# Patient Record
Sex: Male | Born: 1955 | Hispanic: No | Marital: Married | Smoking: Current some day smoker
Health system: Southern US, Community
[De-identification: ages and names within clinical notes are randomized; demographics above are authoritative.]

## PROBLEM LIST (undated history)

## (undated) DIAGNOSIS — I10 Essential (primary) hypertension: Secondary | ICD-10-CM

## (undated) DIAGNOSIS — E663 Overweight: Secondary | ICD-10-CM

## (undated) HISTORY — DX: Overweight: E66.3

## (undated) HISTORY — DX: Essential (primary) hypertension: I10

---

## 1999-11-08 ENCOUNTER — Ambulatory Visit (HOSPITAL_COMMUNITY): Admission: RE | Admit: 1999-11-08 | Discharge: 1999-11-08 | Payer: Self-pay | Admitting: *Deleted

## 2001-01-09 ENCOUNTER — Other Ambulatory Visit: Admission: RE | Admit: 2001-01-09 | Discharge: 2001-01-09 | Payer: Self-pay | Admitting: Urology

## 2001-10-28 ENCOUNTER — Encounter: Admission: RE | Admit: 2001-10-28 | Discharge: 2001-10-28 | Payer: Self-pay | Admitting: Urology

## 2001-10-28 ENCOUNTER — Encounter: Payer: Self-pay | Admitting: Urology

## 2002-10-28 ENCOUNTER — Ambulatory Visit (HOSPITAL_COMMUNITY): Admission: RE | Admit: 2002-10-28 | Discharge: 2002-10-28 | Payer: Self-pay | Admitting: Chiropractor

## 2002-10-28 ENCOUNTER — Encounter: Payer: Self-pay | Admitting: Chiropractor

## 2011-01-17 ENCOUNTER — Encounter: Payer: Self-pay | Admitting: Internal Medicine

## 2011-01-19 ENCOUNTER — Other Ambulatory Visit: Payer: Self-pay | Admitting: Oncology

## 2011-01-19 ENCOUNTER — Encounter (HOSPITAL_BASED_OUTPATIENT_CLINIC_OR_DEPARTMENT_OTHER): Payer: BC Managed Care – PPO | Admitting: Oncology

## 2011-01-19 DIAGNOSIS — R109 Unspecified abdominal pain: Secondary | ICD-10-CM

## 2011-01-19 DIAGNOSIS — D751 Secondary polycythemia: Secondary | ICD-10-CM

## 2011-01-19 LAB — CBC WITH DIFFERENTIAL/PLATELET
BASO%: 0.4 % (ref 0.0–2.0)
Basophils Absolute: 0 10*3/uL (ref 0.0–0.1)
EOS%: 4.1 % (ref 0.0–7.0)
Eosinophils Absolute: 0.2 10*3/uL (ref 0.0–0.5)
HCT: 50.9 % — ABNORMAL HIGH (ref 38.4–49.9)
HGB: 17.6 g/dL — ABNORMAL HIGH (ref 13.0–17.1)
LYMPH%: 23.6 % (ref 14.0–49.0)
MCH: 31.1 pg (ref 27.2–33.4)
MCHC: 34.6 g/dL (ref 32.0–36.0)
MCV: 89.9 fL (ref 79.3–98.0)
MONO#: 0.3 10*3/uL (ref 0.1–0.9)
MONO%: 6.4 % (ref 0.0–14.0)
NEUT#: 3.5 10*3/uL (ref 1.5–6.5)
NEUT%: 65.5 % (ref 39.0–75.0)
Platelets: 143 10*3/uL (ref 140–400)
RBC: 5.67 10*6/uL (ref 4.20–5.82)
RDW: 13.6 % (ref 11.0–14.6)
WBC: 5.3 10*3/uL (ref 4.0–10.3)
lymph#: 1.3 10*3/uL (ref 0.9–3.3)

## 2011-01-19 LAB — CHCC SMEAR

## 2011-01-19 LAB — COMPREHENSIVE METABOLIC PANEL
Albumin: 4.5 g/dL (ref 3.5–5.2)
CO2: 27 mEq/L (ref 19–32)
Glucose, Bld: 92 mg/dL (ref 70–99)
Potassium: 3.7 mEq/L (ref 3.5–5.3)
Sodium: 140 mEq/L (ref 135–145)
Total Protein: 7.3 g/dL (ref 6.0–8.3)

## 2011-01-19 LAB — LACTATE DEHYDROGENASE: LDH: 197 U/L (ref 94–250)

## 2011-01-19 LAB — IRON AND TIBC: Iron: 122 ug/dL (ref 42–165)

## 2011-01-23 LAB — JAK-2 V617F

## 2011-01-26 ENCOUNTER — Ambulatory Visit (HOSPITAL_COMMUNITY)
Admission: RE | Admit: 2011-01-26 | Discharge: 2011-01-26 | Disposition: A | Discharge status: Home / Self Care | Payer: BC Managed Care – PPO | Source: Ambulatory Visit | Attending: Oncology | Admitting: Oncology

## 2011-01-26 DIAGNOSIS — N23 Unspecified renal colic: Secondary | ICD-10-CM | POA: Insufficient documentation

## 2011-01-26 DIAGNOSIS — R109 Unspecified abdominal pain: Secondary | ICD-10-CM

## 2011-01-26 DIAGNOSIS — R1031 Right lower quadrant pain: Secondary | ICD-10-CM | POA: Insufficient documentation

## 2011-01-26 DIAGNOSIS — Z87442 Personal history of urinary calculi: Secondary | ICD-10-CM | POA: Insufficient documentation

## 2011-01-26 DIAGNOSIS — N400 Enlarged prostate without lower urinary tract symptoms: Secondary | ICD-10-CM | POA: Insufficient documentation

## 2011-02-02 ENCOUNTER — Encounter (HOSPITAL_BASED_OUTPATIENT_CLINIC_OR_DEPARTMENT_OTHER): Payer: BC Managed Care – PPO | Admitting: Oncology

## 2011-02-02 DIAGNOSIS — D751 Secondary polycythemia: Secondary | ICD-10-CM

## 2011-02-17 ENCOUNTER — Encounter: Payer: Self-pay | Admitting: Cardiovascular Disease

## 2011-03-08 ENCOUNTER — Ambulatory Visit: Payer: BC Managed Care – PPO | Admitting: Cardiovascular Disease

## 2011-03-15 ENCOUNTER — Encounter: Payer: Self-pay | Admitting: Cardiovascular Disease

## 2011-03-16 ENCOUNTER — Encounter: Payer: Self-pay | Admitting: Cardiovascular Disease

## 2011-03-16 ENCOUNTER — Ambulatory Visit (INDEPENDENT_AMBULATORY_CARE_PROVIDER_SITE_OTHER): Payer: BC Managed Care – PPO | Admitting: Cardiovascular Disease

## 2011-03-16 DIAGNOSIS — R0600 Dyspnea, unspecified: Secondary | ICD-10-CM

## 2011-03-16 DIAGNOSIS — D751 Secondary polycythemia: Secondary | ICD-10-CM | POA: Insufficient documentation

## 2011-03-16 DIAGNOSIS — R0989 Other specified symptoms and signs involving the circulatory and respiratory systems: Secondary | ICD-10-CM

## 2011-03-16 DIAGNOSIS — I10 Essential (primary) hypertension: Secondary | ICD-10-CM

## 2011-03-16 DIAGNOSIS — R079 Chest pain, unspecified: Secondary | ICD-10-CM | POA: Insufficient documentation

## 2011-03-16 NOTE — Progress Notes (Signed)
55 yo referred by [place] for SSCP.  History through interpretor.  Seen at urgent care 02/17/11.  Reviewed ECG early repolarization and otherwise normal.  CRF;s HTN on ACE.  No history of CAD.  Atypical SSCP, lasts minutes, nonexertional but persistant since 7/7.  Hct 50.9.  No chronic lung disease, only 5 cigs / month.  Mild exertional dyspnea.  No history of murmur or congenital heart disease.  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  CXR with [place] scarring RLL nothing els  ROS: Denies fever, malais, weight loss, blurry vision, decreased visual acuity, cough, sputum, SOB, hemoptysis, pleuritic pain, palpitaitons, heartburn, abdominal pain, melena, lower extremity edema, claudication, or rash.  All other systems reviewed and negative   General: Affect appropriate Healthy:  appears stated age HEENT: normal Neck supple with no adenopathy JVP normal no bruits no thyromegaly Lungs clear with no wheezing and good diaphragmatic motion Heart:  S1/S2 no murmur,rub, gallop or click PMI normal Abdomen: benighn, BS positve, no tenderness, no AAA no bruit.  No HSM or HJR Distal pulses intact with no bruits No edema Neuro non-focal Skin warm and dry No muscular weakness  Medications Current Outpatient Prescriptions  Medication Sig Dispense Refill  . lisinopril (PRINIVIL,ZESTRIL) 10 MG tablet Take 10 mg by mouth daily.          Allergies Review of patient's allergies indicates no known allergies.  Family History: No family history on file.  Social History: History   Social History  . Marital Status: Married    Spouse Name: N/A    Number of Children: N/A  . Years of Education: N/A   Occupational History  . Not on file.   Social History Main Topics  . Smoking status: Not on file  . Smokeless tobacco: Not on file  . Alcohol Use:   . Drug Use:   . Sexually Active:    Other Topics Concern  . Not on file    Social History Narrative  . No narrative on file    Electrocardiogram:  NSR 61 Minimal voltage for LVH otherwise normal  Assessment and Plan

## 2011-03-16 NOTE — Assessment & Plan Note (Signed)
Atypical ECG ok  F/U ETT

## 2011-03-16 NOTE — Assessment & Plan Note (Signed)
Well controlled.  Continue current medications and low sodium Dash type diet.    

## 2011-03-16 NOTE — Assessment & Plan Note (Signed)
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  F/U hematology.  Will likely need RBC mass and ABG

## 2011-03-16 NOTE — Assessment & Plan Note (Signed)
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  R/O congenital heart disease and assess EF and LVH

## 2011-03-16 NOTE — Patient Instructions (Addendum)
Your physician recommends that you continue on your current medications as directed. Please refer to the Current Medication list given to you today.  You have been referred to  DR ENNEVER  DX ELEVATED HEMATOCRIT   Your physician has requested that you have an exercise tolerance test. For further information please visit [email or web address] Please also follow instruction sheet, as given.PT'S CONVENIENCE  WITH SCOTT [facility]  DX CHEST PAIN  Your physician has requested that you have an echocardiogram. Echocardiography is a painless test that uses sound waves to create images of your heart. It provides your doctor with information about the size and shape of your heart and how well your heart's chambers and valves are working. This procedure takes approximately one hour. There are no restrictions for this procedure. PT'S CONVENIENCE DX SHORTNESS OF BREATH

## 2011-03-27 ENCOUNTER — Encounter: Payer: Self-pay | Admitting: *Deleted

## 2011-04-06 ENCOUNTER — Other Ambulatory Visit (HOSPITAL_COMMUNITY): Payer: BC Managed Care – PPO | Admitting: Radiology

## 2011-04-06 ENCOUNTER — Encounter: Payer: BC Managed Care – PPO | Admitting: Physician Assistant

## 2011-04-10 ENCOUNTER — Telehealth (HOSPITAL_COMMUNITY): Payer: Self-pay | Admitting: Radiology

## 2011-04-10 NOTE — Telephone Encounter (Signed)
Pt was a NOS CALL PT @[phone] PHONE DC'D NO OTHER  NUMBER LISTED LETTER MAIL TO PT.TO CALL TO [facility] APPT.

## 2011-05-16 ENCOUNTER — Encounter: Payer: Self-pay | Admitting: Cardiovascular Disease

## 2011-08-25 ENCOUNTER — Telehealth: Payer: Self-pay | Admitting: Oncology

## 2011-08-25 NOTE — Telephone Encounter (Signed)
Mailed the pt his feb,june and oct 2013 appt calendar. Unable to reach the pt by telephone. Telephone number is disconnected. lmonvm of julia the interpreter to contact me regarding this pt

## 2011-08-27 NOTE — Telephone Encounter (Signed)
na

## 2011-08-28 ENCOUNTER — Telehealth: Payer: Self-pay | Admitting: Oncology

## 2011-08-28 NOTE — Telephone Encounter (Signed)
S/w luwana from clinical social work regarding the pt appts that needed to be set up with an interpreter for his various appts

## 2011-10-01 ENCOUNTER — Ambulatory Visit: Payer: BC Managed Care – PPO | Admitting: Physician Assistant

## 2011-10-01 ENCOUNTER — Other Ambulatory Visit: Payer: BC Managed Care – PPO | Admitting: Lab

## 2012-01-28 ENCOUNTER — Other Ambulatory Visit: Payer: BC Managed Care – PPO | Admitting: Lab

## 2012-01-29 ENCOUNTER — Telehealth: Payer: Self-pay | Admitting: Oncology

## 2012-01-29 NOTE — Telephone Encounter (Signed)
Received email from [place] @ switchboard to r/s pt's Monday lb appt (6/17). S/w pt and per due to he is leaving town for about the next 15 days he will call back to r/s when he returns.

## 2012-04-30 ENCOUNTER — Telehealth: Payer: Self-pay | Admitting: Oncology

## 2012-04-30 NOTE — Telephone Encounter (Signed)
s.w. pt and he wanted to cancel appt and call back when he gets in town. Called Dr Gaylyn Rong and he is aware.  sed

## 2012-05-18 ENCOUNTER — Other Ambulatory Visit: Payer: Self-pay | Admitting: Family Medicine

## 2012-05-19 NOTE — Telephone Encounter (Signed)
Chart pulled to PA pool at nurses station [phone]

## 2012-05-20 ENCOUNTER — Other Ambulatory Visit: Payer: Self-pay | Admitting: *Deleted

## 2012-05-30 ENCOUNTER — Ambulatory Visit: Payer: BC Managed Care – PPO | Admitting: Oncology

## 2012-05-30 ENCOUNTER — Other Ambulatory Visit: Payer: BC Managed Care – PPO | Admitting: Lab

## 2012-06-21 ENCOUNTER — Telehealth: Payer: Self-pay

## 2012-06-21 NOTE — Telephone Encounter (Signed)
Denied - patient needs an appointment

## 2012-06-21 NOTE — Telephone Encounter (Signed)
Last seen 9/12 by Dr. Neva Seat chart is at nurses station for review

## 2012-06-21 NOTE — Telephone Encounter (Signed)
[facility] called for pt in regards to refill on prescription Viagra. Pt was seen by Dr Neva Seat Please call [facility] back at [phone]

## 2012-06-23 NOTE — Telephone Encounter (Signed)
Called pt to to advise

## 2012-06-28 IMAGING — US US RENAL
1 series · 14 of 25 positions shown · non-contrast
Comparison: None.

CLINICAL DATA: Bilateral renal pain. Right-sided flank pain.
History kidney stones.

RENAL/URINARY TRACT ULTRASOUND COMPLETE

[Series 1: us renal · 0.30mm/px · 14 of 48 slices shown]
[im 1/48]
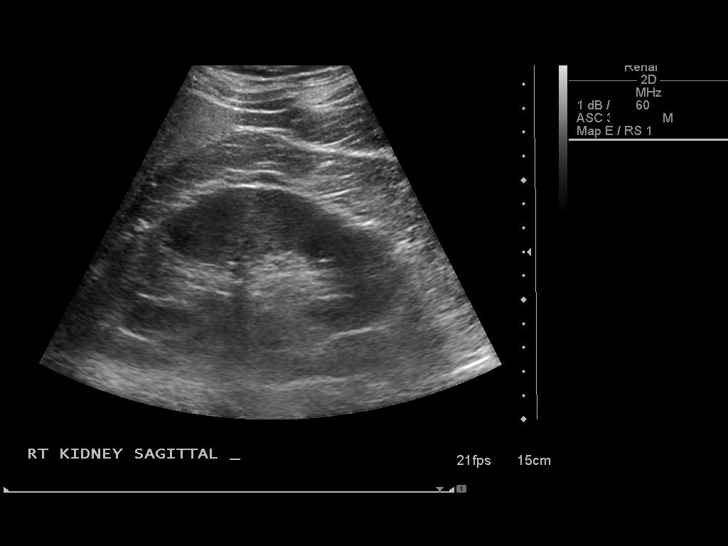
[im 4/48]
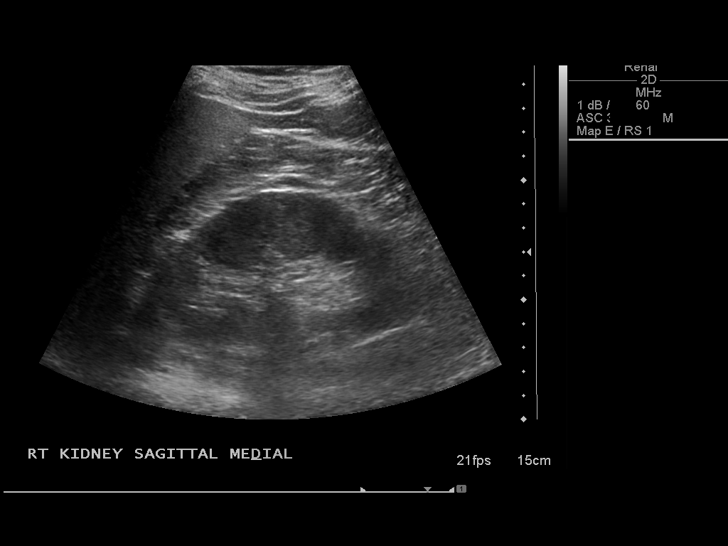
[im 8/48]
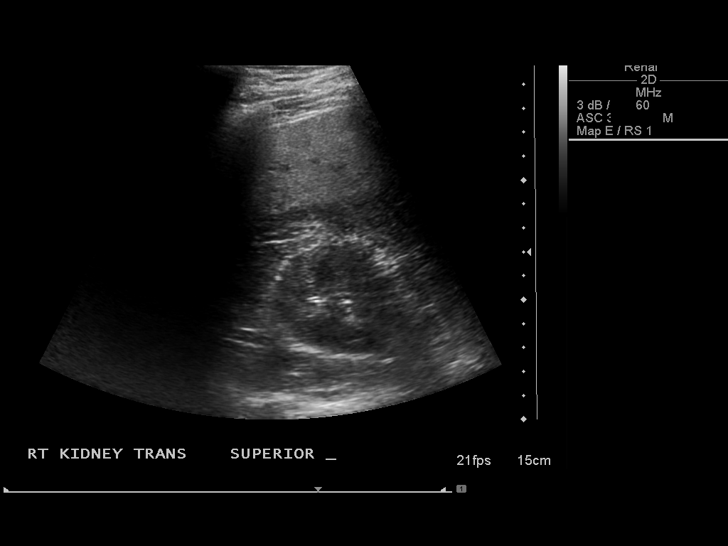
[im 12/48]
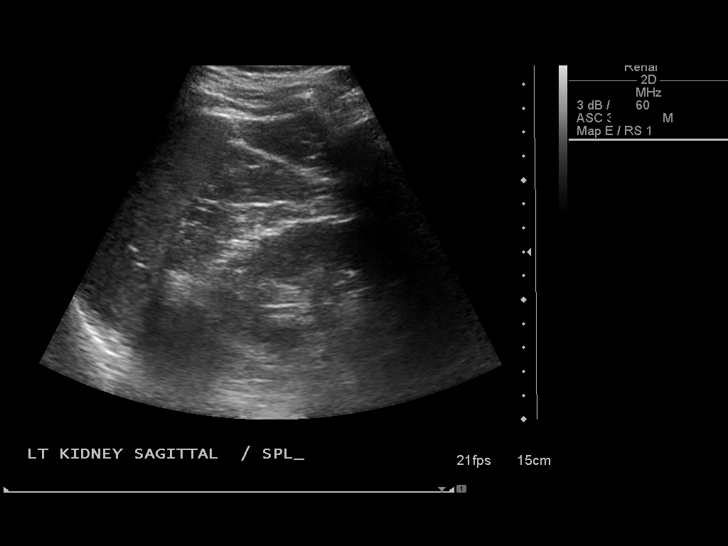
[im 16/48]
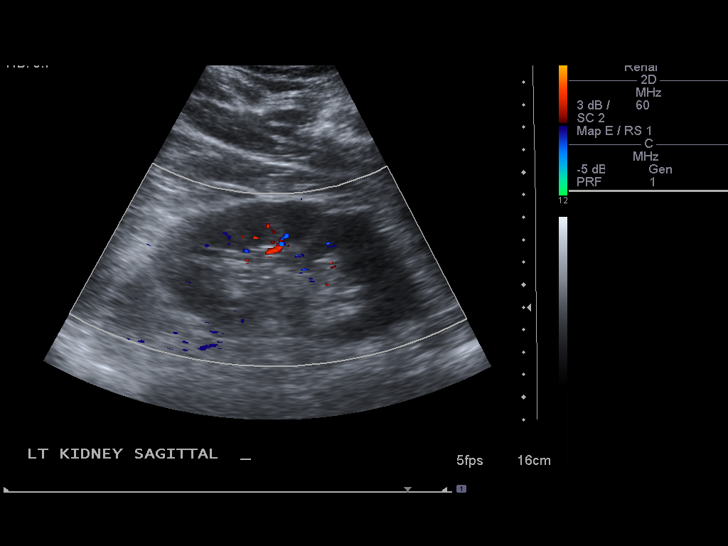
[im 18/48]
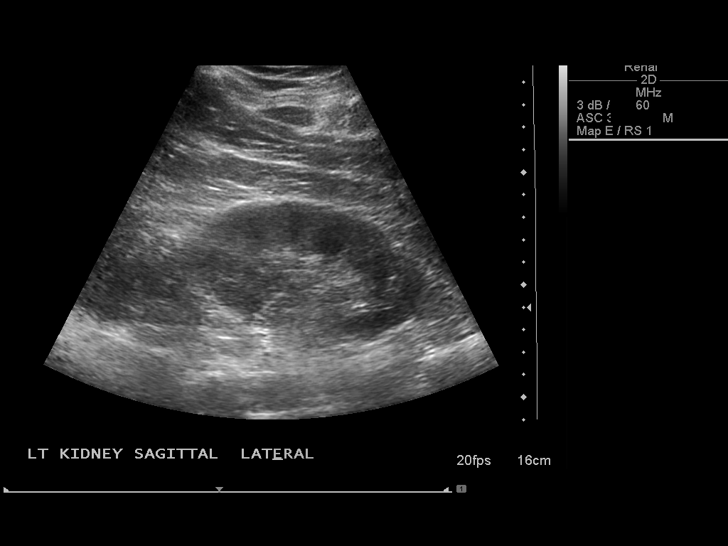
[im 22/48]
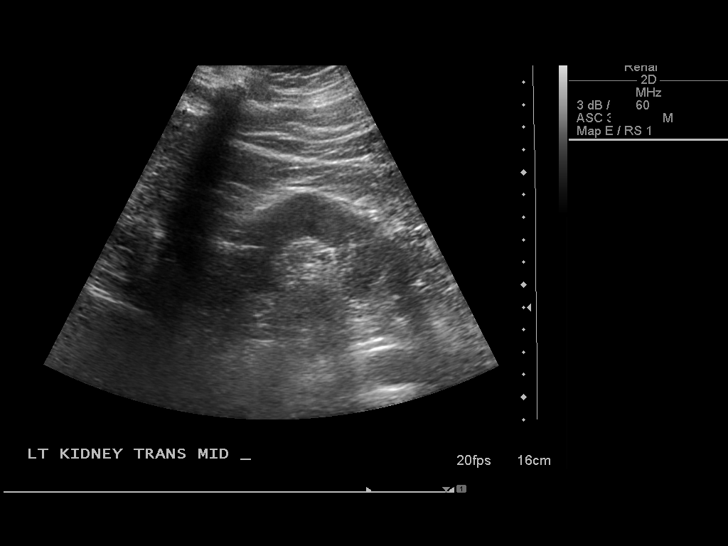
[im 26/48]
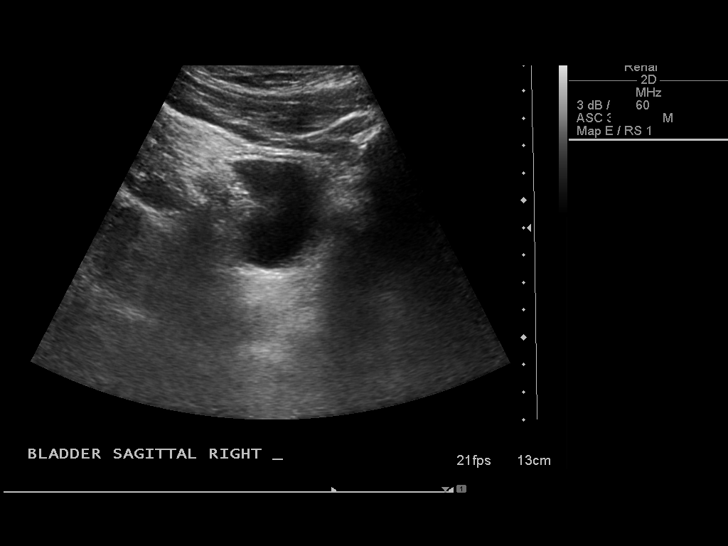
[im 30/48]
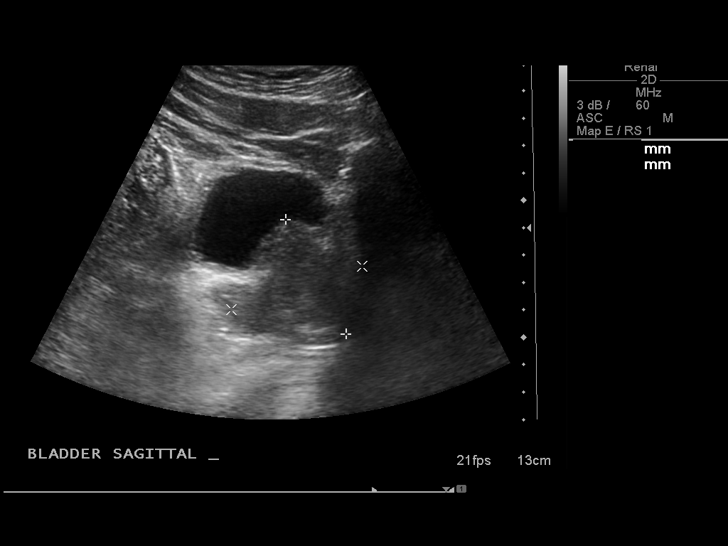
[im 32/48]
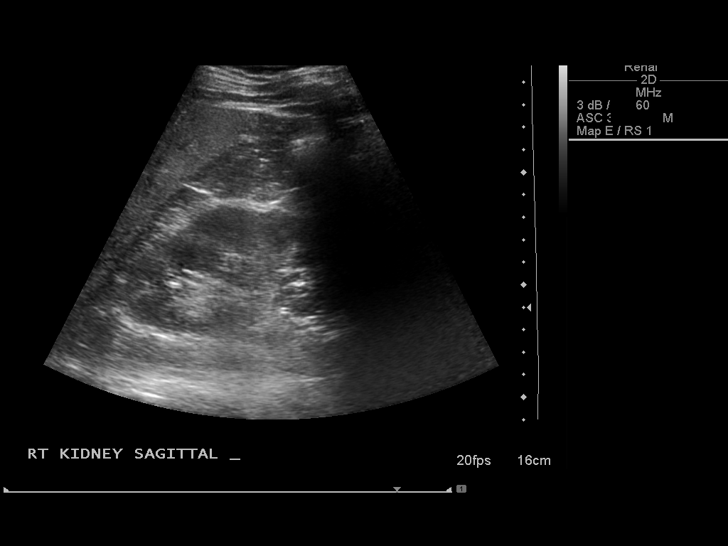
[im 36/48]
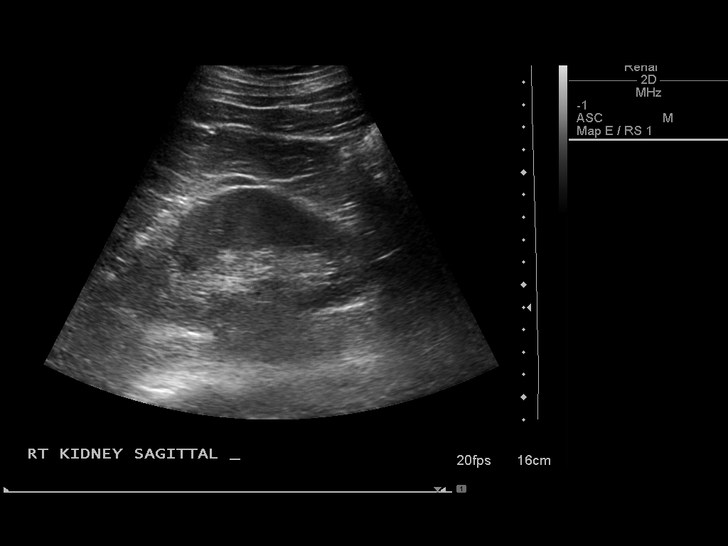
[im 40/48]
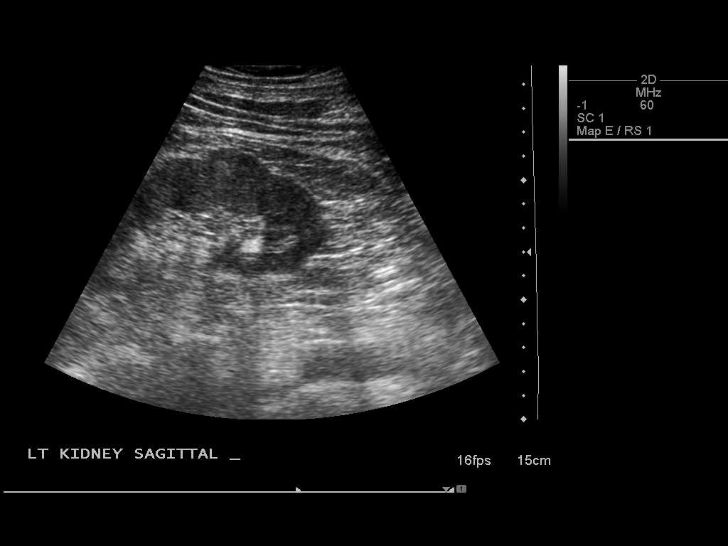
[im 44/48]
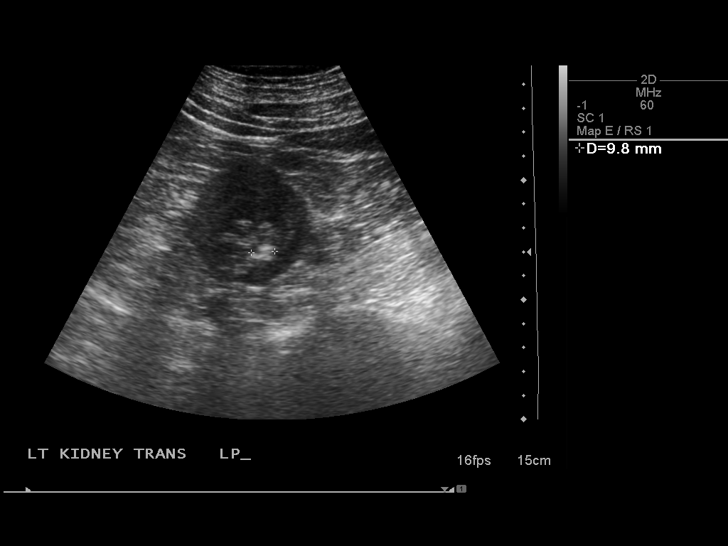
[im 48/48]
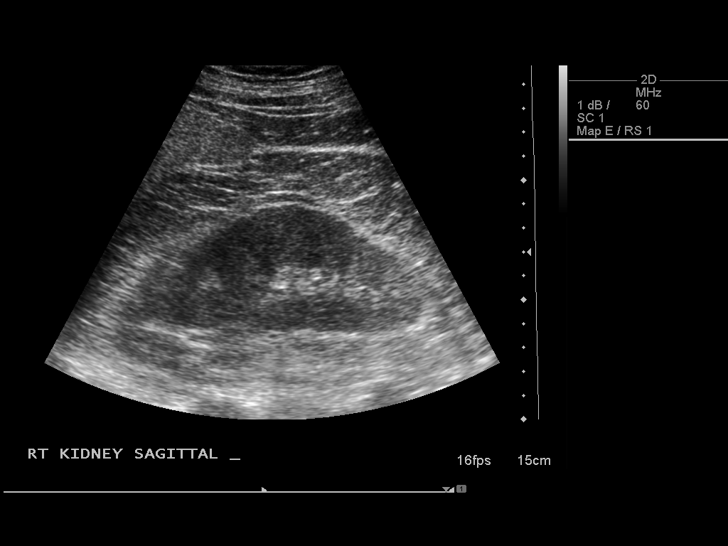

[14 of 25 positions shown; findings below may reference images not displayed]

FINDINGS: Right Kidney:  12.0 cm. No hydronephrosis.  No definite right-sided
renal stones.

Left Kidney:  12.3 cm. No hydronephrosis.   4 mm interpolar
echogenic focus could represent a collecting system stone.  A
cm lower pole left renal collecting system focus of echogenicity.

Bladder:  Within normal limits.  Moderate prostatomegaly.
IMPRESSION: 1.  No hydronephrosis.
2.  No definite  right-sided renal calculi.
3.  Suspect left sided nonobstructive renal calculi.
4.  Prostatomegaly.

## 2012-07-28 ENCOUNTER — Other Ambulatory Visit: Payer: Self-pay | Admitting: *Deleted

## 2012-07-28 MED ORDER — LISINOPRIL 10 MG PO TABS: 10.0000 mg | ORAL_TABLET | Freq: Every day | ORAL | Status: DC

## 2017-03-08 ENCOUNTER — Encounter: Payer: Self-pay | Admitting: Family Medicine

## 2017-03-08 ENCOUNTER — Ambulatory Visit (INDEPENDENT_AMBULATORY_CARE_PROVIDER_SITE_OTHER): Payer: Self-pay | Admitting: Family Medicine

## 2017-03-08 VITALS — BP 134/78 | HR 72 | Temp 98.9°F | Resp 16 | Ht 64.0 in | Wt 170.6 lb

## 2017-03-08 DIAGNOSIS — I10 Essential (primary) hypertension: Secondary | ICD-10-CM

## 2017-03-08 DIAGNOSIS — Z5181 Encounter for therapeutic drug level monitoring: Secondary | ICD-10-CM

## 2017-03-08 NOTE — Patient Instructions (Addendum)
Prevencin de enfermedades cardacas (Heart Disease Prevention) Las enfermedades cardacas son Neomia Dearuna de las principales causas de [place]. Hay muchas cosas que se pueden hacer para prevenir las enfermedades cardacas. HAGA ACTIVIDAD FSICA La actividad fsica es buena para el corazn. Ayuda a Scientist, physiologicalcontrolar la presin arterial, los niveles de colesterol y Ewingel peso. Intente hacer Tesoro Corporationactividad fsica todos los das. Pregntele a su mdico cules son las actividades ms recomendables para usted. MANTENGA UN PESO SALUDABLE El exceso de peso puede forzar mucho al corazn y Audiological scientistafectar la presin arterial y los niveles de Okaycolesterol. Si el mdico se lo recomienda, haga dieta y ejercicio para bajar de peso. CONSUMA ALIMENTOS CARDIOSALUDABLES Siga un plan de alimentacin saludable como se lo haya recomendado el mdico o el nutricionista. Entre los alimentos cardiosaludables, se incluyen los siguientes:  Alimentos ricos en [place]fibra. Estos incluyen salvado de avena, avena y cereales y panes integrales.  Frutas y verduras. Evite:  Alcohol.  Comidas fritas.  Alimentos con alto contenido de grasas saturadas. Estos incluyen carnes, mantequilla, lcteos enteros, margarina y aceite de coco o palma.  Alimentos salados. Estos incluyen alimentos enlatados, embutidos, bocadillos salados y comidas rpidas. MANTENGA LOS NIVELES DE COLESTEROL BAJO CONTROL El colesterol es una sustancia que se [place] para muchas funciones importantes. Cuando los niveles son elevados, el colesterol puede adherirse al interior de los vasos sanguneos y Radio producerhacer que estos se Armed forces training and education officerestrechen u [place] Sonoraobstruyan. Esto puede causar dolor en el pecho (angina) e infarto de miocardio. Mantenga los niveles de colesterol bajo control segn lo recomendado por el mdico. Controle su nivel de colesterol por lo menos una vez al ao. Los niveles de colesterol recomendados (en mg/dl) para la Reynolds Americanmayora de las personas son los siguientes:  El colesterol total por debajo de 200.  El  colesterol LDL por debajo de 100.  El colesterol HDL por encima de 40 en los hombres y por encima de 50 en las mujeres.  Los triglicridos por debajo de 150. MANTENGA SU PRESIN ARTERIAL BAJO CONTROL La presin arterial elevada (hipertensin arterial) aumenta el riesgo de ictus y de tener otras enfermedades cardacas. Mantenga la presin arterial controlada segn lo recomendado por el mdico. Pregntele al mdico si necesita tratamiento para bajar la presin arterial. Si usted tiene entre 18 y 39 aos, debe medirse la presin arterial cada 3 a 5 aos. Si usted tiene 40 aos o ms, debe medirse la presin arterial Allied Waste Industriestodos los aos. NO FUME NI CONSUMA PRODUCTOS CON TABACO El humo del tabaco puede daar el corazn y los vasos sanguneos. No consuma ningn producto que contenga tabaco, incluidos los cigarrillos electrnicos. Si necesita ayuda para dejar de fumar, consulte al mdico. TOME LOS MEDICAMENTOS COMO SE LO HAYAN INDICADO Tome los medicamentos solamente como se lo haya indicado el mdico. Pregntele al mdico si debe tomar una aspirina por da. Las aspirinas pueden ayudar a reducir el riesgo de enfermedades cardacas e ictus. PARA OBTENER MS INFORMACIN Para obtener ms informacin sobre las enfermedades cardacas, ingrese en el sitio web de la Asociacin Estadounidense del Programmer, multimediaCorazn (American Heart Association) en [email or web address].eeamericanheart.org. Esta informacin no tiene como fin reemplazar el consejo del mdico. Asegrese de hacerle al mdico cualquier pregunta que tenga. Document Released: 05/27/2009 Document Revised: 08/20/2014 Document Reviewed: 09/23/2013 Elsevier Interactive Patient Education  2017 [employer]

## 2017-03-08 NOTE — Progress Notes (Signed)
  Chief Complaint  Patient presents with  . Medication Refill    lisinopril    HPI  Translator ID: [id number]  He is currently taking his blood pressure medication lisinopril He states that he is taking lisinopril 20mg   He denies chest pains, cough, sob, muscle cramps He is taking his medications daily He avoids adding salt to his foods He does not smoke  Past Medical History:  Diagnosis Date  . Chest pain   . Hypertension   . Over weight     Current Outpatient Prescriptions  Medication Sig Dispense Refill  . lisinopril (PRINIVIL,ZESTRIL) 10 MG tablet Take 1 tablet (10 mg total) by mouth daily. Needs office visit 30 tablet 0   No current facility-administered medications for this visit.     Allergies: No Known Allergies  No past surgical history on file.  Social History   Social History  . Marital status: Married    Spouse name: N/A  . Number of children: N/A  . Years of education: N/A   Social History Main Topics  . Smoking status: Current Some Day Smoker    Types: Cigarettes  . Smokeless tobacco: Never Used  . Alcohol use Yes     Comment: beer and tequila  . Drug use: No  . Sexual activity: Not Asked   Other Topics Concern  . None   Social History Narrative  . None    ROS See hpi  Objective: Vitals:   03/08/17 1659 03/08/17 1729  BP: (!) 151/83 134/78  Pulse: 72 72  Resp: 16   Temp: 98.9 F (37.2 C)   TempSrc: Oral   SpO2: 94%   Weight: 170 lb 9.6 oz (77.4 kg)   Height: 5\' 4"  (1.626 m)     Physical Exam  Constitutional: He is oriented to person, place, and time. He appears well-developed and well-nourished.  HENT:  Head: Normocephalic and atraumatic.  Eyes: Conjunctivae and EOM are normal.  Cardiovascular: Normal rate, regular rhythm and normal heart sounds.   Pulmonary/Chest: Effort normal and breath sounds normal. No respiratory distress. He has no wheezes.  Neurological: He is alert and oriented to person, place, and time.  Skin:  Skin is warm. Capillary refill takes less than 2 seconds. No rash noted.  Psychiatric: He has a normal mood and affect. His behavior is normal. Judgment and thought content normal.    Assessment and Plan Jerry Robinson was seen today for medication refill.  Diagnoses and all orders for this visit:  Essential hypertension- bp well controlled with lisinopril 20mg  Educated pt to return at least yearly for bp follow up Will check urine microalbumin If all results are normal will give multiple refills to ensure compliance -     Basic metabolic panel -     Lipid panel  Encounter for medication monitoring -     Basic metabolic panel -     Lipid panel      -  Urine microalbumin    Jerry Robinson

## 2017-03-09 LAB — BASIC METABOLIC PANEL
BUN/Creatinine Ratio: 18 (ref 9–20)
BUN: 16 mg/dL (ref 6–24)
CALCIUM: 9.4 mg/dL (ref 8.7–10.2)
CHLORIDE: 108 mmol/L — AB (ref 96–106)
CO2: 21 mmol/L (ref 20–29)
Creatinine, Ser: 0.91 mg/dL (ref 0.76–1.27)
GFR calc non Af Amer: 93 mL/min/{1.73_m2} (ref >59)
GFR, EST AFRICAN AMERICAN: 107 mL/min/{1.73_m2} (ref >59)
Glucose: 100 mg/dL — ABNORMAL HIGH (ref 65–99)
POTASSIUM: 4.1 mmol/L (ref 3.5–5.2)
Sodium: 145 mmol/L — ABNORMAL HIGH (ref 134–144)

## 2017-03-09 LAB — LIPID PANEL
Chol/HDL Ratio: 2.4 ratio (ref 0.0–5.0)
Cholesterol, Total: 109 mg/dL (ref 100–199)
HDL: 45 mg/dL (ref >39)
LDL Calculated: 49 mg/dL (ref 0–99)
Triglycerides: 77 mg/dL (ref 0–149)
VLDL Cholesterol Cal: 15 mg/dL (ref 5–40)

## 2017-03-09 LAB — MICROALBUMIN, URINE: MICROALBUM., U, RANDOM: 25.9 ug/mL

## 2017-03-09 MED ORDER — LISINOPRIL 20 MG PO TABS: 20.0000 mg | ORAL_TABLET | Freq: Every day | ORAL | 3 refills | Status: DC

## 2017-03-09 NOTE — Addendum Note (Signed)
Addended by: Collie SiadSTALLINGS, Burns Timson A on: 03/09/2017 08:03 AM   Modules accepted: Orders

## 2017-03-13 ENCOUNTER — Telehealth: Payer: Self-pay | Admitting: Family Medicine

## 2017-03-13 NOTE — Telephone Encounter (Signed)
PATIENT IS RETURNING RODI'S CALL FROM TUES. REGARDING HIS LAB RESULTS. IT WOULD BE BEST IF SOMEONE WOULD CALL HIM BACK WHO SPEAKS SPANISH PER PATIENT. BEST PHONE [phone] (CELL) MBC

## 2017-03-15 NOTE — Telephone Encounter (Signed)
Pt advised.

## 2018-06-22 ENCOUNTER — Other Ambulatory Visit: Payer: Self-pay | Admitting: Family Medicine

## 2018-06-23 NOTE — Telephone Encounter (Signed)
Requested medication (s) are due for refill today: Yes  Requested medication (s) are on the active medication list: Yes  Last refill:  03/09/17  Future visit scheduled: No  Notes to clinic:  Unable to refill, expired Rx     Requested Prescriptions  Pending Prescriptions Disp Refills   lisinopril (PRINIVIL,ZESTRIL) 20 MG tablet [Pharmacy Med Name: LISINOPRIL 20MG      TAB] 90 tablet 3    Sig: TAKE 1 TABLET BY MOUTH ONCE DAILY     Cardiovascular:  ACE Inhibitors Failed - 06/23/2018 12:42 PM      Failed - Cr in normal range and within 180 days    Creatinine, Ser  Date Value Ref Range Status  03/08/2017 0.91 0.76 - 1.27 mg/dL Final         Failed - K in normal range and within 180 days    Potassium  Date Value Ref Range Status  03/08/2017 4.1 3.5 - 5.2 mmol/L Final         Failed - Valid encounter within last 6 months    Recent Outpatient Visits          1 year ago Essential hypertension   [facility], Manus Rudd, MD             Passed - Patient is not pregnant      Passed - Last BP in normal range    BP Readings from Last 1 Encounters:  03/08/17 134/78

## 2018-06-23 NOTE — Telephone Encounter (Signed)
Patient called and advised he will need to schedule an appointment in order to receive medication refills. He says he works and will need an appointment after 5pm and he works out of town. He says he has enough medication for this week. He says he doesn't need a doctor, just his medicine ordered. I advised he will need to be seen in the office at least yearly in order to continue receiving refills, last OV 03/09/17. Patient verbalized understanding and says he will speak with his boss and call back to schedule the appointment.

## 2021-09-01 ENCOUNTER — Telehealth: Payer: Self-pay

## 2021-09-01 NOTE — Telephone Encounter (Signed)
Copied from CRM [id number]. Topic: Appointment Scheduling - Scheduling Inquiry for Clinic >> Aug 31, 2021  9:08 AM Elliot Gault wrote: Patient would like additional information regarding the orange card. Patient states he does not have money to pay the self pay fee for the NPA. Unsure if he wants to wait until the next available NPA on 11/13/2021 with Bertram Denver, NP. Please call back patient to discuss further. Patient will wait to speak with Mikle Bosworth prior to scheduling NPA

## 2021-09-01 NOTE — Telephone Encounter (Signed)
I return Pt call, he was inform that he need to be an establish Pt first before he can apply for the financial program

## 2022-03-14 ENCOUNTER — Other Ambulatory Visit (HOSPITAL_COMMUNITY): Payer: Self-pay | Admitting: Urology

## 2022-03-14 ENCOUNTER — Other Ambulatory Visit: Payer: Self-pay | Admitting: Urology

## 2022-03-14 DIAGNOSIS — R310 Gross hematuria: Secondary | ICD-10-CM

## 2022-03-14 DIAGNOSIS — Z87442 Personal history of urinary calculi: Secondary | ICD-10-CM

## 2022-03-19 ENCOUNTER — Other Ambulatory Visit: Payer: Self-pay | Admitting: Urology

## 2022-03-19 DIAGNOSIS — R3129 Other microscopic hematuria: Secondary | ICD-10-CM

## 2022-03-21 ENCOUNTER — Ambulatory Visit (HOSPITAL_COMMUNITY): Admission: RE | Admit: 2022-03-21 | Payer: Self-pay | Source: Ambulatory Visit

## 2022-04-11 ENCOUNTER — Ambulatory Visit (HOSPITAL_COMMUNITY): Payer: Self-pay

## 2022-04-18 ENCOUNTER — Other Ambulatory Visit (HOSPITAL_COMMUNITY): Payer: Self-pay
